# Patient Record
Sex: Female | Born: 1960 | Race: White | Hispanic: No | Marital: Married
Health system: Southern US, Community
[De-identification: ages and names within clinical notes are randomized; demographics above are authoritative.]

---

## 2010-07-26 ENCOUNTER — Encounter: Payer: Self-pay | Admitting: Emergency Medicine

## 2010-07-26 ENCOUNTER — Ambulatory Visit (INDEPENDENT_AMBULATORY_CARE_PROVIDER_SITE_OTHER): Admitting: Emergency Medicine

## 2010-07-26 DIAGNOSIS — S058X9A Other injuries of unspecified eye and orbit, initial encounter: Secondary | ICD-10-CM | POA: Insufficient documentation

## 2010-08-03 NOTE — Assessment & Plan Note (Signed)
Summary: FOREIGN BODY IN LT EYE/WSE (rm 3)   Vital Signs:  Patient Profile:   50 Years Old Female CC:      ?foreign body in left eye x this AM Height:     70 inches Weight:      308 pounds O2 Sat:      100 % O2 treatment:    Room Air Temp:     97.7 degrees F oral Pulse rate:   86 / minute Resp:     20 per minute BP sitting:   137 / 97  (left arm) Cuff size:   large  Vitals Entered By: Lajean Saver RN (July 26, 2010 1:53 PM)              Vision Screening: Left eye w/o correction: 20 / 100 Right Eye w/o correction: 20 / 25 Both eyes w/o correction:  20/ 25       Vision Comments: Glasses not used for exam  Vision Entered By: Lajean Saver RN (July 26, 2010 1:53 PM)    Updated Prior Medication List: PROTONIX 40 MG TBEC (PANTOPRAZOLE SODIUM)   Current Allergies: ! FLAGYLHistory of Present Illness Chief Complaint: ?foreign body in left eye x this AM History of Present Illness: Woke up this morning and her eye hurt, itchy, red.  Doesn't recall any trauma, flying debris, or scratches.  She wears glasses but not contacts.  This happens about once a month and she usually just flushes her eyes which helps after a while.  No other med problems other than GERD.  +Photophobia.  REVIEW OF SYSTEMS Constitutional Symptoms      Denies fever, chills, night sweats, weight loss, weight gain, and fatigue.  Eyes       Complains of eye pain, eye drainage, and glasses.      Denies change in vision, contact lenses, and eye surgery.      Comments: redness Ear/Nose/Throat/Mouth       Denies hearing loss/aids, change in hearing, ear pain, ear discharge, dizziness, frequent runny nose, frequent nose bleeds, sinus problems, sore throat, hoarseness, and tooth pain or bleeding.  Respiratory       Denies dry cough, productive cough, wheezing, shortness of breath, asthma, bronchitis, and emphysema/COPD.  Cardiovascular       Denies murmurs, chest pain, and tires easily with exhertion.      Gastrointestinal       Denies stomach pain, nausea/vomiting, diarrhea, constipation, blood in bowel movements, and indigestion. Genitourniary       Denies painful urination, kidney stones, and loss of urinary control. Neurological       Denies paralysis, seizures, and fainting/blackouts. Musculoskeletal       Denies muscle pain, joint pain, joint stiffness, decreased range of motion, redness, swelling, muscle weakness, and gout.  Skin       Denies bruising, unusual mles/lumps or sores, and hair/skin or nail changes.  Psych       Denies mood changes, temper/anger issues, anxiety/stress, speech problems, depression, and sleep problems. Other Comments: patient awoke with left eye irritation this AM. Redness and drainage   Past History:  Past Medical History: Unremarkable  Past Surgical History: Hysterectomy- partial Tubal ligation Tonsillectomy cyst removed from right breast left foot spur  Social History: Never Smoked Alcohol use-no Drug use-no Smoking Status:  never Drug Use:  no Fluoroscein exam shows uptake at 3 o'clock on the sclera with small raised area as well as a very tiny pinpoint at 6 o'clock.  No dendritic lesion  or ulcers.  PERRLA EOMI.  No foreign bodies seen.  Mild conjunctival and scleral erythema. Assessment New Problems: CORNEAL ABRASION (ICD-918.1)   Plan New Medications/Changes: POLYTRIM 10000-0.1 UNIT/ML-% SOLN (POLYMYXIN B-TRIMETHOPRIM) 2 drops L eye Q4-6 hrs for 1 week  #1 bottle x 0, 07/26/2010, Hoyt Koch MD  New Orders: New Patient Level III [phone] Planning Comments:   Rx for Polytrim.  Since this is a recurrent problem, would like her to see ophthalmology to make sure there isn't another cause of her problem.  Pt understands and agrees.    The patient and/or caregiver has been counseled thoroughly with regard to medications prescribed including dosage, schedule, interactions, rationale for use, and possible side effects and they  verbalize understanding.  Diagnoses and expected course of recovery discussed and will return if not improved as expected or if the condition worsens. Patient and/or caregiver verbalized understanding.  Prescriptions: POLYTRIM 10000-0.1 UNIT/ML-% SOLN (POLYMYXIN B-TRIMETHOPRIM) 2 drops L eye Q4-6 hrs for 1 week  #1 bottle x 0   Entered and Authorized by:   Hoyt Koch MD   Signed by:   Hoyt Koch MD on 07/26/2010   Method used:   Print then Give to Patient   RxID:   [id number]   Orders Added: 1)  New Patient Level III [52841]

## 2016-09-23 ENCOUNTER — Other Ambulatory Visit (HOSPITAL_COMMUNITY): Payer: Self-pay

## 2016-09-23 ENCOUNTER — Inpatient Hospital Stay
Admission: AD | Admit: 2016-09-23 | Discharge: 2016-10-13 | Disposition: A | Discharge status: Skilled Nursing Facility | Payer: Self-pay | Source: Ambulatory Visit | Attending: Internal Medicine | Admitting: Internal Medicine

## 2016-09-23 DIAGNOSIS — Z931 Gastrostomy status: Secondary | ICD-10-CM

## 2016-09-23 DIAGNOSIS — R112 Nausea with vomiting, unspecified: Secondary | ICD-10-CM

## 2016-09-23 LAB — VANCOMYCIN, TROUGH: VANCOMYCIN TR: 18 ug/mL (ref 15–20)

## 2016-09-23 MED ORDER — IOPAMIDOL (ISOVUE-300) INJECTION 61%
50.0000 mL | Freq: Once | INTRAVENOUS | Status: AC | PRN
  Administered 2016-09-23: 50 mL

## 2016-09-24 LAB — CBC WITH DIFFERENTIAL/PLATELET
BASOS ABS: 0 10*3/uL (ref 0.0–0.1)
BASOS PCT: 0 %
EOS ABS: 0.2 10*3/uL (ref 0.0–0.7)
Eosinophils Relative: 3 %
HCT: 29.5 % — ABNORMAL LOW (ref 36.0–46.0)
Hemoglobin: 8.6 g/dL — ABNORMAL LOW (ref 12.0–15.0)
Lymphocytes Relative: 25 %
Lymphs Abs: 1.2 10*3/uL (ref 0.7–4.0)
MCH: 24.6 pg — ABNORMAL LOW (ref 26.0–34.0)
MCHC: 29.2 g/dL — AB (ref 30.0–36.0)
MCV: 84.3 fL (ref 78.0–100.0)
MONO ABS: 0.3 10*3/uL (ref 0.1–1.0)
MONOS PCT: 6 %
NEUTROS ABS: 3.2 10*3/uL (ref 1.7–7.7)
Neutrophils Relative %: 66 %
PLATELETS: 343 10*3/uL (ref 150–400)
RBC: 3.5 MIL/uL — ABNORMAL LOW (ref 3.87–5.11)
RDW: 17.6 % — AB (ref 11.5–15.5)
WBC: 4.9 10*3/uL (ref 4.0–10.5)

## 2016-09-24 LAB — COMPREHENSIVE METABOLIC PANEL
ALT: 38 U/L (ref 14–54)
AST: 78 U/L — AB (ref 15–41)
Albumin: 2.3 g/dL — ABNORMAL LOW (ref 3.5–5.0)
Alkaline Phosphatase: 252 U/L — ABNORMAL HIGH (ref 38–126)
Anion gap: 6 (ref 5–15)
BILIRUBIN TOTAL: 0.4 mg/dL (ref 0.3–1.2)
BUN: 15 mg/dL (ref 6–20)
CO2: 26 mmol/L (ref 22–32)
CREATININE: 0.99 mg/dL (ref 0.44–1.00)
Calcium: 8.9 mg/dL (ref 8.9–10.3)
Chloride: 108 mmol/L (ref 101–111)
GFR calc Af Amer: >60 mL/min (ref >60)
Glucose, Bld: 120 mg/dL — ABNORMAL HIGH (ref 65–99)
Potassium: 3.9 mmol/L (ref 3.5–5.1)
Sodium: 140 mmol/L (ref 135–145)
TOTAL PROTEIN: 5.4 g/dL — AB (ref 6.5–8.1)

## 2016-09-24 LAB — PROTIME-INR
INR: 1.07
PROTHROMBIN TIME: 13.9 s (ref 11.4–15.2)

## 2016-09-24 LAB — URINALYSIS, ROUTINE W REFLEX MICROSCOPIC
Bilirubin Urine: NEGATIVE
GLUCOSE, UA: NEGATIVE mg/dL
Hgb urine dipstick: NEGATIVE
Ketones, ur: NEGATIVE mg/dL
Nitrite: NEGATIVE
PH: 6 (ref 5.0–8.0)
Protein, ur: 30 mg/dL — AB
Specific Gravity, Urine: 1.013 (ref 1.005–1.030)

## 2016-09-24 LAB — MAGNESIUM: MAGNESIUM: 1.4 mg/dL — AB (ref 1.7–2.4)

## 2016-09-24 LAB — PHOSPHORUS: PHOSPHORUS: 4.7 mg/dL — AB (ref 2.5–4.6)

## 2016-09-25 LAB — PHOSPHORUS: PHOSPHORUS: 3.9 mg/dL (ref 2.5–4.6)

## 2016-09-25 LAB — MAGNESIUM: MAGNESIUM: 2.1 mg/dL (ref 1.7–2.4)

## 2016-09-25 LAB — BASIC METABOLIC PANEL
Anion gap: 7 (ref 5–15)
BUN: 10 mg/dL (ref 6–20)
CALCIUM: 8.9 mg/dL (ref 8.9–10.3)
CO2: 26 mmol/L (ref 22–32)
CREATININE: 0.97 mg/dL (ref 0.44–1.00)
Chloride: 103 mmol/L (ref 101–111)
GFR calc non Af Amer: >60 mL/min (ref >60)
Glucose, Bld: 117 mg/dL — ABNORMAL HIGH (ref 65–99)
Potassium: 3.6 mmol/L (ref 3.5–5.1)
SODIUM: 136 mmol/L (ref 135–145)

## 2016-09-25 LAB — URINE CULTURE

## 2016-09-27 ENCOUNTER — Other Ambulatory Visit (HOSPITAL_COMMUNITY)

## 2016-09-27 LAB — CBC WITH DIFFERENTIAL/PLATELET
Basophils Absolute: 0 10*3/uL (ref 0.0–0.1)
Basophils Relative: 0 %
Eosinophils Absolute: 0.2 10*3/uL (ref 0.0–0.7)
Eosinophils Relative: 5 %
HCT: 28.2 % — ABNORMAL LOW (ref 36.0–46.0)
HEMOGLOBIN: 8.3 g/dL — AB (ref 12.0–15.0)
LYMPHS ABS: 1.1 10*3/uL (ref 0.7–4.0)
LYMPHS PCT: 22 %
MCH: 24.6 pg — ABNORMAL LOW (ref 26.0–34.0)
MCHC: 29.4 g/dL — ABNORMAL LOW (ref 30.0–36.0)
MCV: 83.7 fL (ref 78.0–100.0)
Monocytes Absolute: 0.5 10*3/uL (ref 0.1–1.0)
Monocytes Relative: 10 %
NEUTROS PCT: 63 %
Neutro Abs: 3.2 10*3/uL (ref 1.7–7.7)
Platelets: 356 10*3/uL (ref 150–400)
RBC: 3.37 MIL/uL — AB (ref 3.87–5.11)
RDW: 17.4 % — ABNORMAL HIGH (ref 11.5–15.5)
WBC: 5 10*3/uL (ref 4.0–10.5)

## 2016-09-27 LAB — BASIC METABOLIC PANEL
ANION GAP: 9 (ref 5–15)
BUN: 9 mg/dL (ref 6–20)
CHLORIDE: 102 mmol/L (ref 101–111)
CO2: 27 mmol/L (ref 22–32)
Calcium: 9.2 mg/dL (ref 8.9–10.3)
Creatinine, Ser: 0.94 mg/dL (ref 0.44–1.00)
GFR calc Af Amer: >60 mL/min (ref >60)
GFR calc non Af Amer: >60 mL/min (ref >60)
GLUCOSE: 131 mg/dL — AB (ref 65–99)
POTASSIUM: 3.5 mmol/L (ref 3.5–5.1)
SODIUM: 138 mmol/L (ref 135–145)

## 2016-09-27 LAB — PHOSPHORUS: Phosphorus: 3.4 mg/dL (ref 2.5–4.6)

## 2016-09-27 LAB — MAGNESIUM: MAGNESIUM: 1.6 mg/dL — AB (ref 1.7–2.4)

## 2016-09-27 LAB — PROTIME-INR
INR: 1.04
INR: 0.93
PROTHROMBIN TIME: 13.6 s (ref 11.4–15.2)
PROTHROMBIN TIME: 12.4 s (ref 11.4–15.2)

## 2016-09-28 ENCOUNTER — Other Ambulatory Visit (HOSPITAL_COMMUNITY)

## 2016-09-28 ENCOUNTER — Ambulatory Visit (HOSPITAL_COMMUNITY)

## 2016-09-28 LAB — VANCOMYCIN, TROUGH: VANCOMYCIN TR: 18 ug/mL (ref 15–20)

## 2016-09-28 LAB — PROTIME-INR
INR: 0.96
PROTHROMBIN TIME: 12.7 s (ref 11.4–15.2)

## 2016-09-29 ENCOUNTER — Other Ambulatory Visit (HOSPITAL_BASED_OUTPATIENT_CLINIC_OR_DEPARTMENT_OTHER): Payer: Medicare Other

## 2016-09-29 ENCOUNTER — Other Ambulatory Visit (HOSPITAL_COMMUNITY): Payer: Self-pay

## 2016-09-29 DIAGNOSIS — R7881 Bacteremia: Secondary | ICD-10-CM | POA: Diagnosis not present

## 2016-09-29 LAB — CBC
HEMATOCRIT: 27.5 % — AB (ref 36.0–46.0)
HEMOGLOBIN: 8.1 g/dL — AB (ref 12.0–15.0)
MCH: 24.4 pg — AB (ref 26.0–34.0)
MCHC: 29.5 g/dL — AB (ref 30.0–36.0)
MCV: 82.8 fL (ref 78.0–100.0)
Platelets: 359 10*3/uL (ref 150–400)
RBC: 3.32 MIL/uL — ABNORMAL LOW (ref 3.87–5.11)
RDW: 17.4 % — AB (ref 11.5–15.5)
WBC: 6.7 10*3/uL (ref 4.0–10.5)

## 2016-09-29 LAB — PROTIME-INR
INR: 1.03
Prothrombin Time: 13.5 seconds (ref 11.4–15.2)

## 2016-09-29 MED ORDER — PERFLUTREN LIPID MICROSPHERE
INTRAVENOUS | Status: AC
  Filled 2016-09-29: qty 10

## 2016-09-29 NOTE — Progress Notes (Signed)
  Echocardiogram 2D Echocardiogram has been performed.  Cindy Green M 09/29/2016, 8:48 AM

## 2016-09-30 LAB — PROTIME-INR
INR: 1.1
Prothrombin Time: 14.2 seconds (ref 11.4–15.2)

## 2016-09-30 MED ORDER — PERFLUTREN LIPID MICROSPHERE
1.0000 mL | INTRAVENOUS | Status: AC | PRN
  Administered 2016-09-29: 2 mL via INTRAVENOUS

## 2016-10-01 LAB — PROTIME-INR
INR: 1.4
PROTHROMBIN TIME: 17.3 s — AB (ref 11.4–15.2)

## 2016-10-02 LAB — PROTIME-INR
INR: 1.45
Prothrombin Time: 17.7 seconds — ABNORMAL HIGH (ref 11.4–15.2)

## 2016-10-03 LAB — PROTIME-INR
INR: 1.49
PROTHROMBIN TIME: 18.1 s — AB (ref 11.4–15.2)

## 2016-10-04 LAB — CBC WITH DIFFERENTIAL/PLATELET
BASOS PCT: 0 %
Basophils Absolute: 0 10*3/uL (ref 0.0–0.1)
EOS ABS: 0.6 10*3/uL (ref 0.0–0.7)
EOS PCT: 7 %
HCT: 29.2 % — ABNORMAL LOW (ref 36.0–46.0)
HEMOGLOBIN: 8.7 g/dL — AB (ref 12.0–15.0)
LYMPHS ABS: 1.6 10*3/uL (ref 0.7–4.0)
Lymphocytes Relative: 20 %
MCH: 24.9 pg — AB (ref 26.0–34.0)
MCHC: 29.8 g/dL — ABNORMAL LOW (ref 30.0–36.0)
MCV: 83.4 fL (ref 78.0–100.0)
Monocytes Absolute: 0.6 10*3/uL (ref 0.1–1.0)
Monocytes Relative: 7 %
NEUTROS PCT: 66 %
Neutro Abs: 5.2 10*3/uL (ref 1.7–7.7)
PLATELETS: 359 10*3/uL (ref 150–400)
RBC: 3.5 MIL/uL — AB (ref 3.87–5.11)
RDW: 18.1 % — ABNORMAL HIGH (ref 11.5–15.5)
WBC: 8 10*3/uL (ref 4.0–10.5)

## 2016-10-04 LAB — PROTIME-INR
INR: 1.74
PROTHROMBIN TIME: 20.5 s — AB (ref 11.4–15.2)

## 2016-10-04 LAB — BASIC METABOLIC PANEL
Anion gap: 7 (ref 5–15)
BUN: 20 mg/dL (ref 6–20)
CALCIUM: 9 mg/dL (ref 8.9–10.3)
CHLORIDE: 101 mmol/L (ref 101–111)
CO2: 27 mmol/L (ref 22–32)
CREATININE: 1.12 mg/dL — AB (ref 0.44–1.00)
GFR, EST NON AFRICAN AMERICAN: 53 mL/min — AB (ref >60)
Glucose, Bld: 123 mg/dL — ABNORMAL HIGH (ref 65–99)
Potassium: 3.8 mmol/L (ref 3.5–5.1)
SODIUM: 135 mmol/L (ref 135–145)

## 2016-10-04 LAB — PHOSPHORUS: PHOSPHORUS: 3.8 mg/dL (ref 2.5–4.6)

## 2016-10-04 LAB — MAGNESIUM: MAGNESIUM: 1.7 mg/dL (ref 1.7–2.4)

## 2016-10-05 LAB — PROTIME-INR
INR: 2.35
Prothrombin Time: 26.2 seconds — ABNORMAL HIGH (ref 11.4–15.2)

## 2016-10-06 LAB — VANCOMYCIN, TROUGH: Vancomycin Tr: 12 ug/mL — ABNORMAL LOW (ref 15–20)

## 2016-10-06 LAB — PROTIME-INR
INR: 2.33
PROTHROMBIN TIME: 26 s — AB (ref 11.4–15.2)

## 2016-10-07 ENCOUNTER — Other Ambulatory Visit (HOSPITAL_COMMUNITY): Payer: Self-pay

## 2016-10-07 LAB — PROTIME-INR
INR: 2.95
PROTHROMBIN TIME: 31.4 s — AB (ref 11.4–15.2)

## 2016-10-08 LAB — PROTIME-INR
INR: 2.56
Prothrombin Time: 28 seconds — ABNORMAL HIGH (ref 11.4–15.2)

## 2016-10-09 LAB — CBC
HEMATOCRIT: 29.7 % — AB (ref 36.0–46.0)
Hemoglobin: 9.3 g/dL — ABNORMAL LOW (ref 12.0–15.0)
MCH: 25.1 pg — ABNORMAL LOW (ref 26.0–34.0)
MCHC: 31.3 g/dL (ref 30.0–36.0)
MCV: 80.1 fL (ref 78.0–100.0)
PLATELETS: 421 10*3/uL — AB (ref 150–400)
RBC: 3.71 MIL/uL — ABNORMAL LOW (ref 3.87–5.11)
RDW: 17.9 % — AB (ref 11.5–15.5)
WBC: 9 10*3/uL (ref 4.0–10.5)

## 2016-10-09 LAB — PROTIME-INR
INR: 2.62
PROTHROMBIN TIME: 28.6 s — AB (ref 11.4–15.2)

## 2016-10-10 LAB — PROTIME-INR
INR: 3.04
Prothrombin Time: 32.1 seconds — ABNORMAL HIGH (ref 11.4–15.2)

## 2016-10-10 LAB — BASIC METABOLIC PANEL
Anion gap: 8 (ref 5–15)
BUN: 13 mg/dL (ref 6–20)
CALCIUM: 9.4 mg/dL (ref 8.9–10.3)
CHLORIDE: 97 mmol/L — AB (ref 101–111)
CO2: 26 mmol/L (ref 22–32)
CREATININE: 1.13 mg/dL — AB (ref 0.44–1.00)
GFR calc non Af Amer: 53 mL/min — ABNORMAL LOW (ref >60)
Glucose, Bld: 124 mg/dL — ABNORMAL HIGH (ref 65–99)
Potassium: 3.8 mmol/L (ref 3.5–5.1)
SODIUM: 131 mmol/L — AB (ref 135–145)

## 2016-10-10 LAB — PHOSPHORUS: PHOSPHORUS: 4.8 mg/dL — AB (ref 2.5–4.6)

## 2016-10-10 LAB — MAGNESIUM: Magnesium: 1.7 mg/dL (ref 1.7–2.4)

## 2016-10-11 LAB — PROTIME-INR
INR: 3.21
Prothrombin Time: 33.6 seconds — ABNORMAL HIGH (ref 11.4–15.2)

## 2016-10-12 LAB — PROTIME-INR
INR: 2.87
PROTHROMBIN TIME: 30.6 s — AB (ref 11.4–15.2)

## 2016-10-13 LAB — PROTIME-INR
INR: 2.47
Prothrombin Time: 27.2 seconds — ABNORMAL HIGH (ref 11.4–15.2)

## 2016-12-04 DEATH — deceased

## 2017-08-25 IMAGING — CR DG ABD PORTABLE 1V
2 series · 2 of 2 positions shown · non-contrast
Comparison: Prior radiograph from 09/23/2016.

CLINICAL DATA: Initial evaluation for acute nausea and vomiting.
History of PEG tube. History of prior bowel resection. Diarrhea.

EXAM:
PORTABLE ABDOMEN - 1 VIEW

[AP (1 of 2)]
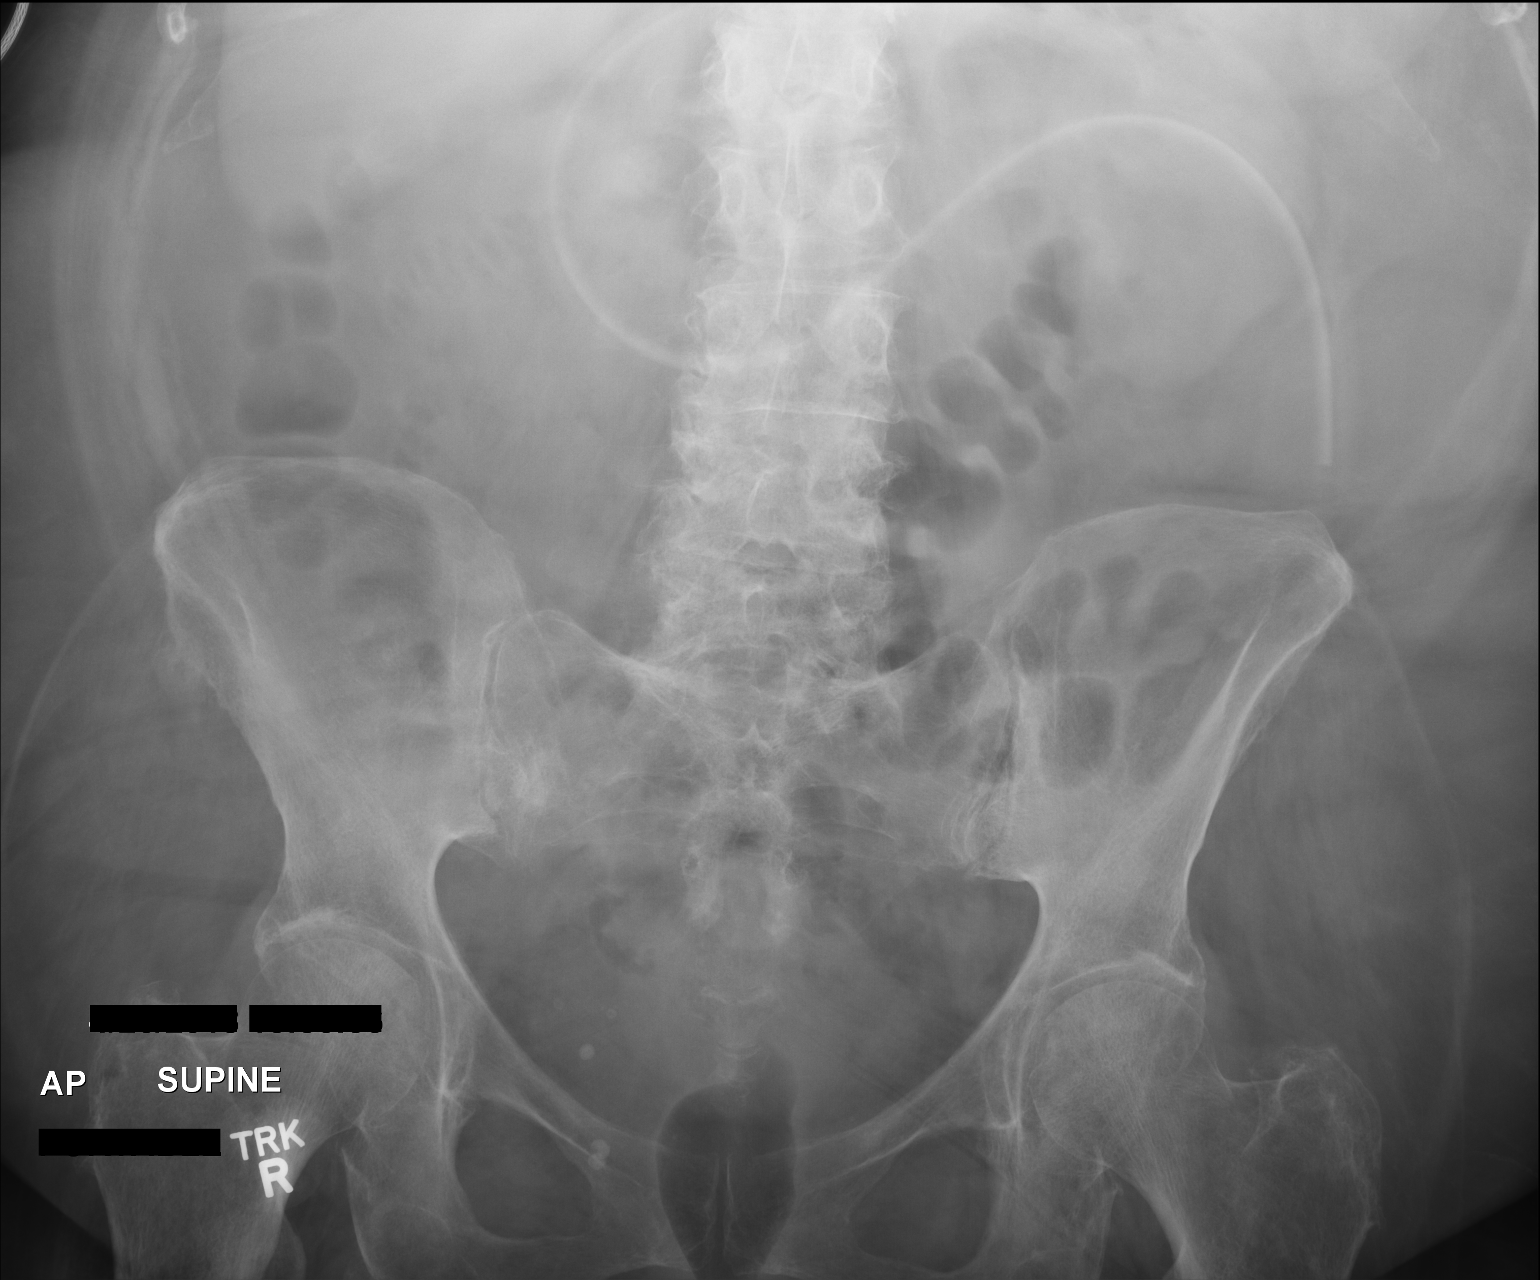

[AP (2 of 2)]
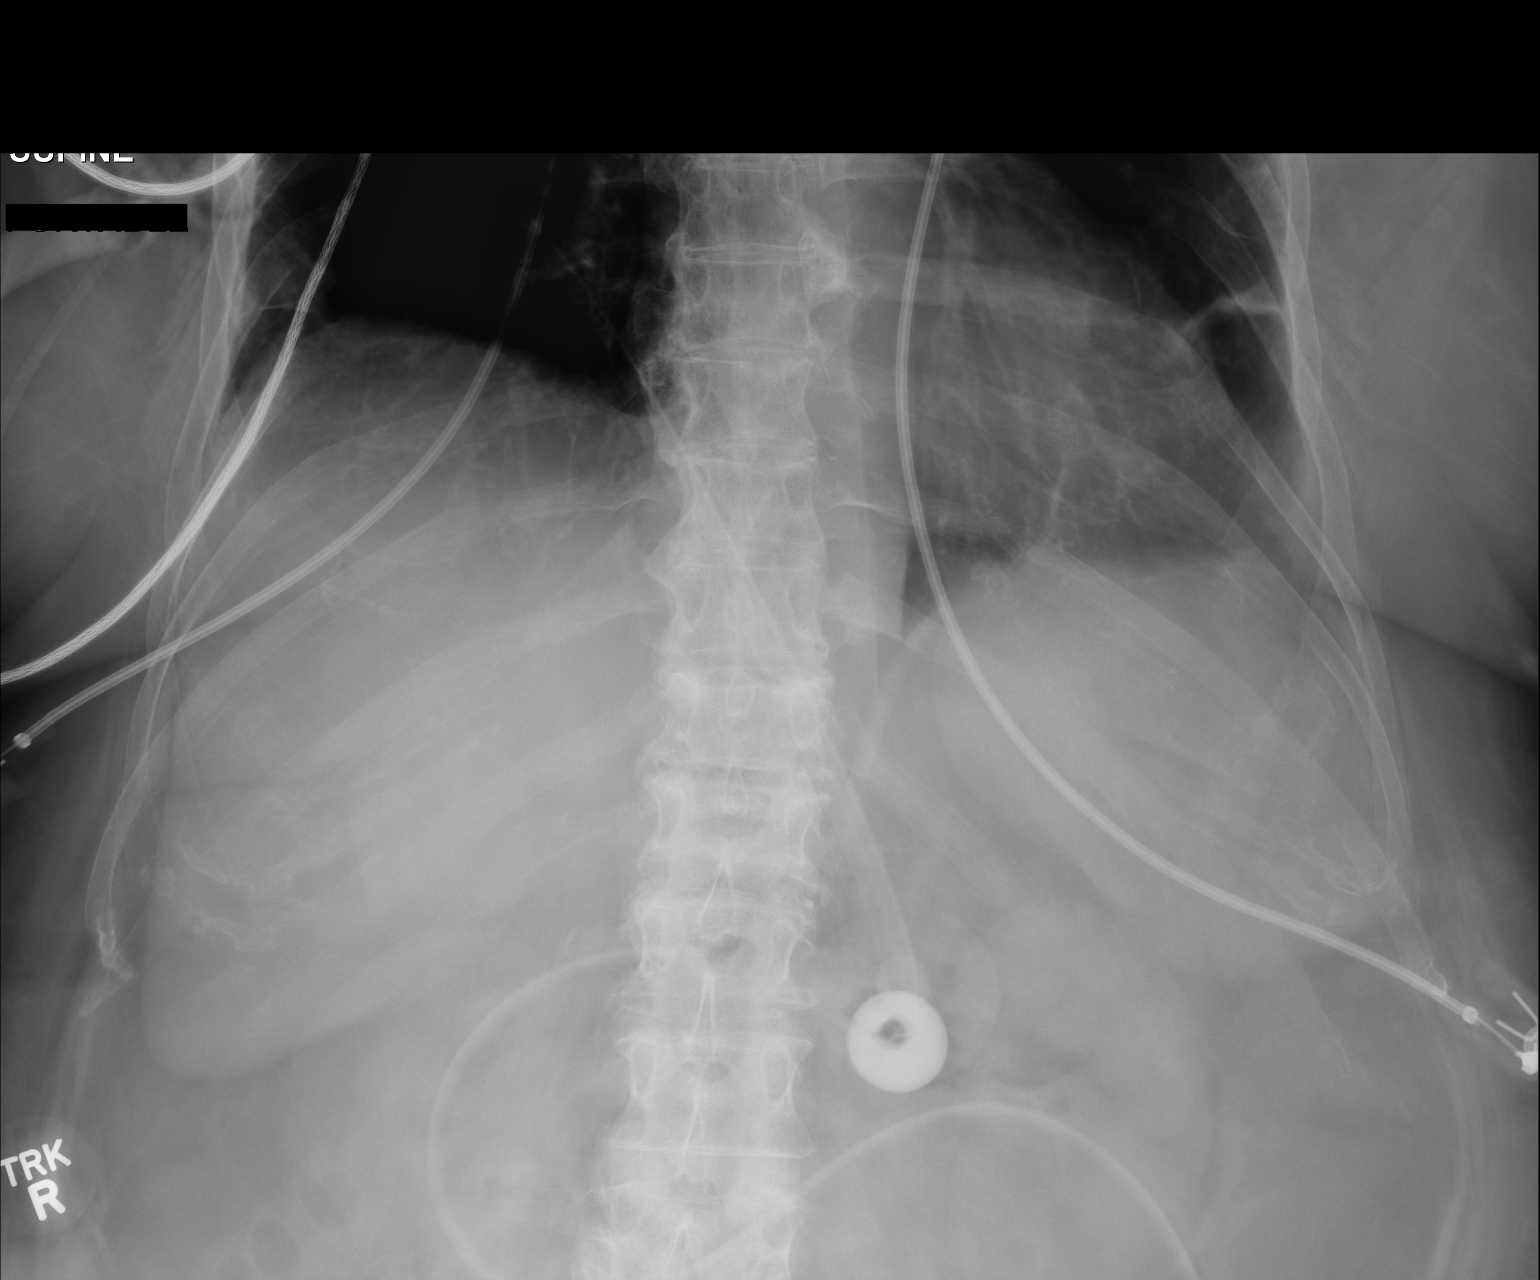

[2 of 2 positions shown; findings below may reference images not displayed]

FINDINGS: Percutaneous GJ tube overlies the left upper quadrant. Bowel gas
pattern within normal limits without evidence for obstruction or
ileus. No abnormal bowel wall thickening. No free air identified on
these limited supine views of the abdomen. No soft tissue mass or
abnormal calcification. The

Degenerative changes noted with the lower lumbar spine.
Osteoarthritic changes present at the hips. No acute osseus
abnormality.

Scattered scarring/ atelectasis noted at the left lung base.
IMPRESSION: 1. Nonobstructive bowel gas pattern with no radiographic evidence
for acute intra-abdominal pathology.
2. Percutaneous GJ tube in place.
